# Patient Record
Sex: Male | Born: 1990 | Race: White | Hispanic: No | Marital: Single | Smoking: Never smoker
Health system: Southern US, Community
[De-identification: ages and names within clinical notes are randomized; demographics above are authoritative.]

## PROBLEM LIST (undated history)

## (undated) HISTORY — PX: HAND SURGERY: SHX662

## (undated) HISTORY — PX: TONSILLECTOMY AND ADENOIDECTOMY: SHX28

---
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
## 2011-01-21 NOTE — Op Note (Signed)
NAMEAZZAN, BUTLER                 ACCOUNT NO.:  [id number]   MEDICAL RECORD NO.:  [id number]          PATIENT TYPE:  AMB   LOCATION:  DSC                          FACILITY:  [facility]   PHYSICIAN:  Katy Fitch. Sypher Montez Hageman., M.D.DATE OF BIRTH:  1990-10-16   DATE OF PROCEDURE:  01/11/2005  DATE OF DISCHARGE:                                 OPERATIVE REPORT   PREOPERATIVE DIAGNOSIS:  Displaced right ring and small finger metacarpal  neck fracture.   POSTOPERATIVE DIAGNOSIS:  Displaced right ring and small finger metacarpal  neck fracture.   OPERATION:  1.  Closed reduction of right ring finger metacarpal neck fracture followed      by Kirschner wire fixation.  2.  Closed reduction of right small finger metacarpal neck fracture with      intramedullary Kirschner wire fixation and percutaneous K-wire fixation      to control rotation.   OPERATION SURGEON:  Lovey Newcomer, MD   ASSISTANT:  Annye Rusk PA-C.   ANESTHESIA:  General by LMA.   SUPERVISING ANESTHESIOLOGIST:  Dr. Noreene Larsson   INDICATIONS:  Wesley Perry is a 20 +84-year-old boy, who sustained closed  displaced apex dorsal angulated metacarpal neck fractures of the ring and  small finger metacarpals.   He was seen at the [facility] where x-  rays documented his fracture predicament.   He had significant angulation and loss of length.  He had an extensor lag at  the MP and PIP joints.   Due to failure to obtain closed reduction, we recommended proceeding with  closed reduction under general anesthesia and Kirschner wire fixation with a  blended technique of intermaxillary fixation of the small finger metacarpal  and picket fence technique for the ring and small finger metacarpals to  control rotation and position.   After informed consent, Mr. Levitz is brought to the operating room at this  time.   Preoperatively, we advised his parents that he would need to protect his  hand very  carefully during the postoperative period so that he had did not  create a complication of fracture of the pins or pathologic fracture of the  bones.   He also needs to keep his dressing strictly dry and to prevent soiling to  prevent infection of his pin tracts.   After informed consent, he is brought to the operating room at this time.   An anesthesia consult was conducted by Dr. Noreene Larsson.   PROCEDURE:  Braxden Lovering is brought to the operating room and placed in  supine position upon the operating table.   Following the induction of general anesthesia by LMA technique, the right  arm was prepped with Betadine soap solution and sterilely draped.   A pneumatic tourniquet was applied to the proximal brachium.   Following elevation of the arm for 1 minute and manual stripping, the  arterial tourniquet was inflated to 220 mmHg.   Procedure commenced with a careful closed reduction maneuver of both  fractures.   Three-point molding was applied to reduce the fractures anatomically.   The intermedullary canal  of the small finger metacarpal was then entered  with the aid of a bone awl and C-arm fluoroscope on a 45-degree angle from  proximal to distal.   We initially attempted reduction with 45 K-wires; however, due to the  density of Mr. Fandrich's bone, I ultimately elected to use a 62 K-wire.   The fracture was reduced while a 0.062-inch Kirschner wire was placed  through the intermaxillary canal across the fracture site, reducing the  fracture in a virtually anatomic position.   A second 0.062 inch Kirschner wire was used after closed reduction of the  ring finger metacarpal with a picket fence technique through the metacarpal  heads.  C-arm fluoroscopy was used to control pin placement accurately.   The 62 K-wire in the small finger was then advanced underneath the K-wire in  the small finger to prevent loss of reduction of the small finger.   An anatomic reduction of both  fractures was achieved with a very  satisfactory-appearing construct.   The pin tracts were then thoroughly lavaged with sterile saline followed by  repair of the entry point wound with intradermal 5-0 chromic.   The pin tracts were then dressed with Xeroflow, sterile gauze, and sterile  Webril followed by application of a safe-position splint, maintaining the  long ring and small fingers at 90 degrees MP flexion with the IP joints in  full extension and the wrist in 30 degrees of dorsiflexion.   There were no apparent complications.   Mr. Willers tolerated surgery and anesthesia well.  He was transferred to the  recovery room with stable vital signs.   He will be discharged in the care of his parents with prescriptions for  Vicodin 5 mg 1 p.o. q.6 h. p.r.n. pain, 20 tablets without refill.  Also, he  is advised to use ibuprofen as needed for milder pain.      RVS/MEDQ  D:  01/11/2005  T:  01/11/2005  Job:  [id number]

## 2015-05-17 ENCOUNTER — Emergency Department (HOSPITAL_COMMUNITY)
Admission: EM | Admit: 2015-05-17 | Discharge: 2015-05-17 | Disposition: A | Discharge status: Home / Self Care | Payer: Self-pay | Attending: Emergency Medicine | Admitting: Emergency Medicine

## 2015-05-17 ENCOUNTER — Encounter (HOSPITAL_COMMUNITY): Payer: Self-pay

## 2015-05-17 DIAGNOSIS — M545 Low back pain: Secondary | ICD-10-CM | POA: Insufficient documentation

## 2015-05-17 MED ORDER — KETOROLAC TROMETHAMINE 60 MG/2ML IM SOLN
60.0000 mg | Freq: Once | INTRAMUSCULAR | Status: AC
  Administered 2015-05-17: 60 mg via INTRAMUSCULAR
  Filled 2015-05-17: qty 2

## 2015-05-17 MED ORDER — IBUPROFEN 800 MG PO TABS: 800.0000 mg | ORAL_TABLET | Freq: Three times a day (TID) | ORAL | Status: DC

## 2015-05-17 MED ORDER — DIAZEPAM 5 MG PO TABS
5.0000 mg | ORAL_TABLET | Freq: Once | ORAL | Status: AC
  Administered 2015-05-17: 5 mg via ORAL
  Filled 2015-05-17: qty 1

## 2015-05-17 NOTE — ED Provider Notes (Signed)
CSN: [id number]     Arrival date & time 05/17/15  1405 History   First MD Initiated Contact with Patient 05/17/15 1710     Chief Complaint  Patient presents with  . Back Pain  . Numbness     (Consider location/radiation/quality/duration/timing/severity/associated sxs/prior Treatment) HPI Wesley Perry is a 24 y.o. male who comes in for evaluation of back pain and foot numbness. Patient states approximately 1-1/2 weeks ago he was at work when he picked up a mop bucket and felt a pop in his lower back and tingling in his left foot. He reports constant sharp pain since that time. Worse with movement. He was seen in an urgent care facility and was given a steroid taper, but reports that his symptoms persist. He reports this discomfort now as an 8/10. Denies any fevers, chills, chronic steroid use, loss of bowel or bladder function, saddle paresthesias. No other aggravating or modifying factors.  History reviewed. No pertinent past medical history. Past Surgical History  Procedure Laterality Date  . Hand surgery     History reviewed. No pertinent family history. Social History  Substance Use Topics  . Smoking status: Never Smoker   . Smokeless tobacco: None  . Alcohol Use: No    Review of Systems A 10 point review of systems was completed and was negative except for pertinent positives and negatives as mentioned in the history of present illness     Allergies  Review of patient's allergies indicates no known allergies.  Home Medications   Prior to Admission medications   Not on File   BP 170/113 mmHg  Pulse 110  Temp(Src) 98.3 F (36.8 C) (Oral)  Resp 20  SpO2 100% Physical Exam  Constitutional: He is oriented to person, place, and time. He appears well-developed and well-nourished.  HENT:  Head: Normocephalic and atraumatic.  Mouth/Throat: Oropharynx is clear and moist.  Eyes: Conjunctivae are normal. Pupils are equal, round, and reactive to light. Right eye exhibits no  discharge. Left eye exhibits no discharge. No scleral icterus.  Neck: Neck supple.  Cardiovascular: Normal rate, regular rhythm and normal heart sounds.   Pulmonary/Chest: Effort normal and breath sounds normal. No respiratory distress. He has no wheezes. He has no rales.  Abdominal: Soft. There is no tenderness.  Musculoskeletal: He exhibits no tenderness.  Tenderness diffusely throughout the lower L-spine and paraspinal musculature. No obvious spasm, deformities, lesions. No bony crepitus.  Neurological: He is alert and oriented to person, place, and time.  Cranial Nerves II-XII grossly intact. Motor and sensation 5/5. Sensation is intact to light touch.  Skin: Skin is warm and dry. No rash noted.  Psychiatric: He has a normal mood and affect.  Nursing note and vitals reviewed.   ED Course  Procedures (including critical care time) Labs Review Labs Reviewed - No data to display  Imaging Review No results found. I have personally reviewed and evaluated these images and lab results as part of my medical decision-making.   EKG Interpretation None     Meds given in ED:  Medications  ketorolac (TORADOL) injection 60 mg (60 mg Intramuscular Given 05/17/15 1748)  diazepam (VALIUM) tablet 5 mg (5 mg Oral Given 05/17/15 1748)    Discharge Medication List as of 05/17/2015  6:17 PM    START taking these medications   Details  ibuprofen (ADVIL,MOTRIN) 800 MG tablet Take 1 tablet (800 mg total) by mouth 3 (three) times daily., Starting 05/17/2015, Until Discontinued, Print  Filed Vitals:   05/17/15 1412 05/17/15 1739  BP: 170/113 134/84  Pulse: 110 71  Temp: 98.3 F (36.8 C)   TempSrc: Oral   Resp: 20 18  SpO2: 100% 98%    MDM  Vitals stable - WNL -afebrile Pt resting comfortably in ED. PE--nonfocal neuro exam. Physical exam otherwise unremarkable. 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 Patient  reports relief with this therapy. Will DC with NSAIDs, supportive care at home and instructions to follow-up with PCP. No other back pain red flags.  I discussed all relevant lab findings and imaging results with pt and they verbalized understanding. Discussed f/u with PCP within 48 hrs and return precautions, pt very amenable to plan.  Final diagnoses:  Left low back pain, with sciatica presence unspecified        Joycie Peek, PA-C 05/17/15 1941  Laurence Spates, MD 05/17/15 2236

## 2015-05-17 NOTE — Discharge Instructions (Signed)
Your evaluated in the ED today for your back pain. There does not appear to be an emergent cause for your symptoms at this time. Your likely suffering from a herniated disc. It is important for you to take your anti-inflammatory as as directed. Activity as tolerated. Use warm compresses and hot showers to help with your discomfort. Follow-up with your doctors as needed. Return to ED for worsening symptoms.  Back Pain, Adult Low back pain is very common. About 1 in 5 people have back pain.The cause of low back pain is rarely dangerous. The pain often gets better over time.About half of people with a sudden onset of back pain feel better in just 2 weeks. About 8 in 10 people feel better by 6 weeks.  CAUSES Some common causes of back pain include:  Strain of the muscles or ligaments supporting the spine.  Wear and tear (degeneration) of the spinal discs.  Arthritis.  Direct injury to the back. DIAGNOSIS Most of the time, the direct cause of low back pain is not known.However, back pain can be treated effectively even when the exact cause of the pain is unknown.Answering your caregiver's questions about your overall health and symptoms is one of the most accurate ways to make sure the cause of your pain is not dangerous. If your caregiver needs more information, he or she may order lab work or imaging tests (X-rays or MRIs).However, even if imaging tests show changes in your back, this usually does not require surgery. HOME CARE INSTRUCTIONS For many people, back pain returns.Since low back pain is rarely dangerous, it is often a condition that people can learn to [facility] their own.   Remain active. It is stressful on the back to sit or stand in one place. Do not sit, drive, or stand in one place for more than 30 minutes at a time. Take short walks on level surfaces as soon as pain allows.Try to increase the length of time you walk each day.  Do not stay in bed.Resting more than 1 or 2  days can delay your recovery.  Do not avoid exercise or work.Your body is made to move.It is not dangerous to be active, even though your back may hurt.Your back will likely heal faster if you return to being active before your pain is gone.  Pay attention to your body when you bend and lift. Many people have less discomfortwhen lifting if they bend their knees, keep the load close to their bodies,and avoid twisting. Often, the most comfortable positions are those that put less stress on your recovering back.  Find a comfortable position to sleep. Use a firm mattress and lie on your side with your knees slightly bent. If you lie on your back, put a pillow under your knees.  Only take over-the-counter or prescription medicines as directed by your caregiver. Over-the-counter medicines to reduce pain and inflammation are often the most helpful.Your caregiver may prescribe muscle relaxant drugs.These medicines help dull your pain so you can more quickly return to your normal activities and healthy exercise.  Put ice on the injured area.  Put ice in a plastic bag.  Place a towel between your skin and the bag.  Leave the ice on for 15-20 minutes, 03-04 times a day for the first 2 to 3 days. After that, ice and heat may be alternated to reduce pain and spasms.  Ask your caregiver about trying back exercises and gentle massage. This may be of some benefit.  Avoid feeling  anxious or stressed.Stress increases muscle tension and can worsen back pain.It is important to recognize when you are anxious or stressed and learn ways to manage it.Exercise is a great option. SEEK MEDICAL CARE IF:  You have pain that is not relieved with rest or medicine.  You have pain that does not improve in 1 week.  You have new symptoms.  You are generally not feeling well. SEEK IMMEDIATE MEDICAL CARE IF:   You have pain that radiates from your back into your legs.  You develop new bowel or bladder  control problems.  You have unusual weakness or numbness in your arms or legs.  You develop nausea or vomiting.  You develop abdominal pain.  You feel faint. Document Released: 08/22/2005 Document Revised: 02/21/2012 Document Reviewed: 12/24/2013 [facility] Patient Information 2015 [place], [place]. This information is not intended to replace advice given to you by your health care provider. Make sure you discuss any questions you have with your health care provider.

## 2015-05-17 NOTE — ED Notes (Signed)
Pt c/o back pain radiating into L leg and L foot numbness x 9 days.  Pain score 9/10.  Pt reports that he injured his back at work after lifting a bucket.  Pt was seen previously and prescribed prednisone and robaxin.  Sts he has been taking them w/o relief.

## 2015-05-17 NOTE — ED Notes (Signed)
Pt and Pt's mother are concerned that they were not given "pain medication" at previous MD visit.  This RN educated them regarding the new medication guidelines.

## 2024-07-24 ENCOUNTER — Other Ambulatory Visit: Payer: Self-pay | Admitting: Orthopedic Surgery

## 2024-07-25 ENCOUNTER — Encounter (HOSPITAL_COMMUNITY): Payer: Self-pay | Admitting: Orthopedic Surgery

## 2024-07-26 ENCOUNTER — Encounter (HOSPITAL_COMMUNITY): Payer: Self-pay | Admitting: Certified Registered"

## 2024-07-26 ENCOUNTER — Encounter (HOSPITAL_COMMUNITY): Payer: Self-pay | Admitting: Orthopedic Surgery

## 2024-07-26 ENCOUNTER — Other Ambulatory Visit: Payer: Self-pay

## 2024-07-26 NOTE — Progress Notes (Signed)
 SDW call  Patient was given pre-op instructions over the phone. Patient verbalized understanding of instructions provided. Denies any SOB, fever or cough    PCP - denies Cardiologist -denies  Pulmonary: denies   PPM/ICD - denies Device Orders - na Rep Notified - na   Chest x-ray - na EKG -  na Stress Test - ECHO -  Cardiac Cath -   Sleep Study/sleep apnea/CPAP: denies  Non-diabetic  Blood Thinner Instructions: denies Aspirin Instructions:denies   ERAS Protcol - Clears until 0900  Anesthesia review: No  Your procedure is scheduled on Monday July 29, 2024  Report to [facility] Main Entrance A at  0930  A.M., then check in with the Admitting office.  Call this number if you have problems the morning of surgery:  [phone]   If you have any questions prior to your surgery date call [phone]: Open Monday-Friday 8am-4pm If you experience any cold or flu symptoms such as cough, fever, chills, shortness of breath, etc. between now and your scheduled surgery, please notify us  at the above number    Remember:  Do not eat after midnight the night before your surgery  You may drink clear liquids until  0900   the morning of your surgery.   Clear liquids allowed are: Water, Non-Citrus Juices (without pulp), Carbonated Beverages, Clear Tea, Black Coffee ONLY (NO MILK, CREAM OR POWDERED CREAMER of any kind), and Gatorade   Take these medicines the morning of surgery with A SIP OF WATER:  None  As of today, STOP taking any Aspirin (unless otherwise instructed by your surgeon) Aleve, Naproxen, Ibuprofen , Motrin , Advil , Goody's, BC's, all herbal medications, fish oil, and all vitamins.

## 2024-07-28 NOTE — H&P (Signed)
 Chief Complaint RIGHT HAND PAIN Patient's Pharmacies [facility]): [phone], [place], [place] [address], Ph [phone], Fax [phone] Vitals 2024-07-25 13:55 Ht: 5 ft 8 in Wt: 210 lbs Allergies Reviewed Allergies NKDA Medications Reviewed Medications oxyCODONE-acetaminophen 5 mg-325 mg tablet TAKE 1 TABLET BY MOUTH EVERY 4 HOURS FOR UP TO 5 DAYS AS NEEDED FOR PAIN 07/22/24   filled surescripts Problems Reviewed Problems Pain of right hand - Onset: 07/23/2024 Subluxation of carpometacarpal joint - Onset: 07/25/2024 Social History None recorded. Surgical & Procedure History None recorded. Past Medical History None recorded. Screening None recorded. HPI DOI: 07-20-24  This patient is a 33 year old male who presents upon referral by Dr. Irving for a right hand injury that occurred falling down the steps at home. He presents today for evaluation with his mother. He reports that his hand appears to be more uncomfortable out of the ulnar gutter splint than in it. He reports that as a teenager he had an injury to the hand that required percutaneous pinning, performed by Dr. Leonor. He reports he has done well in the interim ROS ROS as noted in the HPI Physical Exam The splint is removed. There is bruising on the dorsum and even the palmar surface about the base of the hand. There is global tenderness to palpation at the fifth CMC region, some swelling of the hand, and no abrasions or skin abnormalities overlying the MCP joints.  RADIOGRAPHS: Right hand x-rays obtained 2 days ago with Dr. Irving are reviewed and interpreted to reveal proximal dorsal subluxation of the fifth metacarpal, with a dorsal distal fracture of the hamate, constituting a 5th CMC fracture subluxation Procedure Documentation None recorded. Assessment / Plan 1.  Subluxation of carpometacarpal [facility]) joint  Right fifth CMC fracture-subluxation  Today's findings were reviewed with him  and his mom. It appears as if these findings are all new, but there is a possibility that there was some irregularity at this level from prior injury. There are no x-rays available for review. Clearly with the ecchymosis and swelling, and the findings on x-ray which appear to represent an acute fracture at the base of the fifth metacarpal, this all appears to be acute. I do recommend reduction in stabilization, likely with pin stabilization. Hopefully the reduction can be performed close did not require open reduction, but the possibility for proceeding in that manner was also addressed. We will plan to proceed in the next few days as resources allow. The details of the operative procedure were discussed with the patient.Questions were invited and answered.The goal of the procedure was reviewed.The risks of the procedure includes but is not limited to bleeding; infection; damage to the nerves or blood vessels that could result in bleeding, numbness, weakness, chronic pain, and the need for additional procedures; stiffness; the need for revision surgery; and anesthetic risks, including death. No specific outcome was guaranteed or implied.Informed consent was obtained.

## 2024-07-29 MED ORDER — MIDAZOLAM HCL 2 MG/2ML IJ SOLN
INTRAMUSCULAR | Status: AC
  Filled 2024-07-29: qty 2

## 2024-07-29 MED ORDER — PROPOFOL 10 MG/ML IV BOLUS
INTRAVENOUS | Status: AC
  Filled 2024-07-29: qty 20

## 2024-07-29 MED ORDER — LIDOCAINE 2% (20 MG/ML) 5 ML SYRINGE
INTRAMUSCULAR | Status: AC
  Filled 2024-07-29: qty 5

## 2024-07-29 MED ORDER — FENTANYL CITRATE (PF) 100 MCG/2ML IJ SOLN
INTRAMUSCULAR | Status: AC
  Filled 2024-07-29: qty 2

## 2024-07-29 MED ORDER — PROPOFOL 1000 MG/100ML IV EMUL
INTRAVENOUS | Status: AC
  Filled 2024-07-29: qty 300

## 2024-07-29 MED ORDER — PHENYLEPHRINE HCL (PRESSORS) 10 MG/ML IV SOLN
INTRAVENOUS | Status: AC
  Filled 2024-07-29: qty 1

## 2024-07-29 MED ORDER — VASOPRESSIN 20 UNIT/ML IV SOLN
INTRAVENOUS | Status: AC
  Filled 2024-07-29: qty 1

## 2024-07-29 MED ORDER — ROCURONIUM BROMIDE 10 MG/ML (PF) SYRINGE
PREFILLED_SYRINGE | INTRAVENOUS | Status: AC
  Filled 2024-07-29: qty 10

## 2024-07-29 MED ORDER — PHENYLEPHRINE 80 MCG/ML (10ML) SYRINGE FOR IV PUSH (FOR BLOOD PRESSURE SUPPORT)
PREFILLED_SYRINGE | INTRAVENOUS | Status: AC
  Filled 2024-07-29: qty 10

## 2024-07-29 MED ORDER — EPHEDRINE 5 MG/ML INJ
INTRAVENOUS | Status: AC
  Filled 2024-07-29: qty 5

## 2024-07-29 MED ORDER — ONDANSETRON HCL 4 MG/2ML IJ SOLN
INTRAMUSCULAR | Status: AC
  Filled 2024-07-29: qty 2

## 2024-08-26 ENCOUNTER — Encounter (HOSPITAL_COMMUNITY): Admission: RE | Payer: Self-pay | Source: Home / Self Care

## 2024-08-26 ENCOUNTER — Ambulatory Visit (HOSPITAL_COMMUNITY): Admission: RE | Admit: 2024-08-26 | Payer: Self-pay | Source: Home / Self Care | Admitting: Orthopedic Surgery

## 2024-08-26 SURGERY — PINNING, EXTREMITY, PERCUTANEOUS
Anesthesia: Monitor Anesthesia Care | Site: Hand | Laterality: Right
